# Patient Record
Sex: Male | Born: 1980 | Race: White | Hispanic: No | Smoking: Current every day smoker
Health system: Southern US, Community
[De-identification: ages and names within clinical notes are randomized; demographics above are authoritative.]

## PROBLEM LIST (undated history)

## (undated) DIAGNOSIS — J449 Chronic obstructive pulmonary disease, unspecified: Secondary | ICD-10-CM

---
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
## 2016-03-20 ENCOUNTER — Ambulatory Visit (INDEPENDENT_AMBULATORY_CARE_PROVIDER_SITE_OTHER): Payer: Medicaid Other | Admitting: Podiatry

## 2016-03-20 ENCOUNTER — Encounter: Payer: Self-pay | Admitting: Podiatry

## 2016-03-20 VITALS — BP 124/83 | HR 87 | Resp 14

## 2016-03-20 DIAGNOSIS — R52 Pain, unspecified: Secondary | ICD-10-CM

## 2016-03-20 DIAGNOSIS — B351 Tinea unguium: Secondary | ICD-10-CM | POA: Diagnosis not present

## 2016-03-20 DIAGNOSIS — L03031 Cellulitis of right toe: Secondary | ICD-10-CM

## 2016-03-20 NOTE — Progress Notes (Signed)
Subjective:     Patient ID: Donald Avery, male   DOB: 01-19-1981, 35 y.o.   MRN: [id number]  HPI this patient presents to the office with chief complaint of painful big toe, right foot. Patient states that approximately 4 weeks ago. There was severe pain on the inside border big toe, right foot. He proceeded to self treat and remove the ingrowing toenail himself at this time. The nail has not healed properly and he is having continued pain and discomfort in the great toenail, right foot. This patient also has a medical problem for which he cannot bend over and properly trim his own nails. He has tried but there is hematoma noted at each toe, both feet. He admits only the big toe on the right foot to be painful and desires an evaluation and treatment of this nail   Review of Systems     Objective:   Physical Exam GENERAL APPEARANCE: Alert, conversant. Appropriately groomed. No acute distress.  VASCULAR: Pedal pulses are  palpable at  [facility] bilateral.  Capillary refill time is immediate to all digits,  Normal temperature gradient.  Digital hair growth is present bilateral  NEUROLOGIC: sensation is normal to 5.07 monofilament at 5/5 sites bilateral.  Light touch is intact bilateral, Muscle strength normal.  MUSCULOSKELETAL: acceptable muscle strength, tone and stability bilateral.  Intrinsic muscluature intact bilateral.  Rectus appearance of foot and digits noted bilateral.   DERMATOLOGIC: skin color, texture, and turgor are within normal limits.  No preulcerative lesions or ulcers  are seen, no interdigital maceration noted.  No open lesions present.  . No drainage noted.  Nails  There is thick disfigured discolored nail right hallux with granulation and necrotic tissue in medial aspect right hallux proximal nail fold.      Assessment:     Onychomycosis right hallux   Paronychia right hallux.     Plan:     IE  Debride necrotic tissue medial border right hallux.  Nail surgery for right  hallux removal.  Treatment options and alternatives discussed.  Recommended permanent phenol matrixectomy and patient agreed.  Right hallux  was prepped with alcohol and a toe block of 3cc of 2% lidocaine plain was administered in a digital toe block. .  The toe was then prepped with betadine solution .  The offending nail border was then excised and matrix tissue exposed.  Phenol was then applied to the matrix tissue followed by an alcohol wash.  Antibiotic ointment and a dry sterile dressing was applied.  The patient was dispensed instructions for aftercare. RTC 1 week.  No antibiotics needed after evaluation right hallux.    Helane GuntherGregory Leanor Avery DPM    Helane GuntherGregory Rendon Howell DPM

## 2016-03-27 ENCOUNTER — Ambulatory Visit: Payer: Medicaid Other | Admitting: Podiatry

## 2016-04-03 ENCOUNTER — Ambulatory Visit: Payer: Medicaid Other | Admitting: Podiatry

## 2016-05-01 ENCOUNTER — Encounter: Payer: Self-pay | Admitting: Podiatry

## 2016-05-01 ENCOUNTER — Ambulatory Visit (INDEPENDENT_AMBULATORY_CARE_PROVIDER_SITE_OTHER): Payer: Medicare Other | Admitting: Podiatry

## 2016-05-01 DIAGNOSIS — Z09 Encounter for follow-up examination after completed treatment for conditions other than malignant neoplasm: Secondary | ICD-10-CM

## 2016-05-01 MED ORDER — CEPHALEXIN 500 MG PO CAPS: 500.0000 mg | ORAL_CAPSULE | Freq: Two times a day (BID) | ORAL | Status: AC

## 2016-05-01 NOTE — Progress Notes (Signed)
Subjective:     Patient ID: Donald Avery, male   DOB: [birth year], 35 y.o.   MRN: [id number]  HPI this patient returns the office follow-up for nail surgery for the removal of the great toenail, right foot surgery was done back in April and he never came in for his follow-up examination, he now says that there is an area of drainage at the site of the nail and pain on the  outside border big toe, right foot.  He presents the office today for an evaluation and treatment of his toenail   Review of Systems     Objective:   Physical Exam GENERAL APPEARANCE: Alert, conversant. Appropriately groomed. No acute distress.  VASCULAR: Pedal pulses are  [facility]pable [facility]  [facility].  Capillary refill time is immediate to all digits,  Normal temperature gradient.  Digital hair growth is present bilateral  NEUROLOGIC: sensation is normal to 5.07 monofilament at 5/5 sites bilateral.  Light touch is intact bilateral, Muscle strength normal.  MUSCULOSKELETAL: acceptable muscle strength, tone and stability bilateral.  Intrinsic muscluature intact bilateral.  Rectus appearance of foot and digits noted bilateral.   DERMATOLOGIC: skin color, texture, and turgor are within normal limits.  No preulcerative lesions or ulcers  are seen, no interdigital maceration noted.  No open lesions present.  . No drainage noted. NAIL  Necrotic tissue laterally right hallux.  Small ulcerated region medial proximal border,  No redness or swelling to nail fold.     Assessment:     S/p nail surgery     Plan:     ROV  Debride necrotic tissue. Prescribe cephalexin.  RTC 3 weeks.   Helane GuntherGregory Abrar Koone DPM

## 2016-05-22 ENCOUNTER — Ambulatory Visit (INDEPENDENT_AMBULATORY_CARE_PROVIDER_SITE_OTHER): Payer: Medicare Other | Admitting: Podiatry

## 2016-05-22 ENCOUNTER — Encounter: Payer: Self-pay | Admitting: Podiatry

## 2016-05-22 DIAGNOSIS — R52 Pain, unspecified: Secondary | ICD-10-CM

## 2016-05-22 DIAGNOSIS — L03031 Cellulitis of right toe: Secondary | ICD-10-CM

## 2016-05-22 DIAGNOSIS — B351 Tinea unguium: Secondary | ICD-10-CM

## 2016-05-22 NOTE — Progress Notes (Signed)
Subjective:     Patient ID: Donald Avery, male   DOB: 1981-08-10, 35 y.o.   MRN: [id number]  HPI this patient presents to the office follow-up for an infection in the big toe, right foot. He was seen 3 weeks ago and a prescription of cephalexin was called in to the pharmacy. He says that after 5 days the infection in his big toe, right foot, improved. He now says that the fifth toe on the right foot has been causing him pain for 2 months. Patient states that when he walks he puts  more pressure on the outside of his right foot which  causes severe pain fifth toenail right foot. He says there is no drainage only severe pain. He requests an invaluable evaluation of this condition at this visit   Review of Systems     Objective:   Physical Exam GENERAL APPEARANCE: Alert, conversant. Appropriately groomed. No acute distress.  VASCULAR: Pedal pulses are  palpable at  [facility] bilateral.  Capillary refill time is immediate to all digits,  Normal temperature gradient.  Digital hair growth is present bilateral  NEUROLOGIC: sensation is normal to 5.07 monofilament at 5/5 sites bilateral.  Light touch is intact bilateral, Muscle strength normal.  MUSCULOSKELETAL: acceptable muscle strength, tone and stability bilateral.  Intrinsic muscluature intact bilateral.  Rectus appearance of foot and digits noted bilateral.   DERMATOLOGIC: skin color, texture, and turgor are within normal limits.  No preulcerative lesions or ulcers  are seen, no interdigital maceration noted.  No open lesions present.   No drainage noted.  NAILS  There is callus tissue on the right great toenail with no evidence of infection.  His fifth toenail is thick disfigured and discolored.  Redness and inflammation noted medial aspect fifth toenail right foot.      Assessment:     Onychomycosis     Plan:     ROV  His great toenail has healed and is no longer infected. He has a fifth toenail, right foot, which is traumatized and  moving causing him significant pain during gait. Nail surgery was performed on fifth toe right foot.  Treatment options and alternatives discussed.  Recommended permanent phenol matrixectomy and patient agreed.  Right fifth toe  was prepped with alcohol and a toe block of 3cc of 2% lidocaine plain was administered in a digital toe block. .  The toe was then prepped with betadine solution .  The offending nail border was then excised and matrix tissue exposed.  Phenol was then applied to the matrix tissue followed by an alcohol wash.  Antibiotic ointment and a dry sterile dressing was applied.  The patient was dispensed instructions for aftercare.  Cephalexin was prescribed.  RTC 2 weeks.   Donald Avery DPM     Donald GuntherGregory Layni Kreamer DPM

## 2016-05-23 ENCOUNTER — Other Ambulatory Visit: Payer: Self-pay | Admitting: *Deleted

## 2016-05-23 MED ORDER — CEPHALEXIN 500 MG PO CAPS: 500.0000 mg | ORAL_CAPSULE | Freq: Four times a day (QID) | ORAL | Status: AC

## 2016-06-05 ENCOUNTER — Encounter: Payer: Self-pay | Admitting: Podiatry

## 2016-06-05 ENCOUNTER — Ambulatory Visit (INDEPENDENT_AMBULATORY_CARE_PROVIDER_SITE_OTHER): Payer: Medicare Other | Admitting: Podiatry

## 2016-06-05 DIAGNOSIS — Z09 Encounter for follow-up examination after completed treatment for conditions other than malignant neoplasm: Secondary | ICD-10-CM

## 2016-06-05 NOTE — Progress Notes (Signed)
Patient ID: Donald HissRichard Sobol, male   DOB: Aug 30, 1981, 35 y.o.   MRN: [id number] This patient returns to the office following nail surgery two  weeks ago.  The patient says toe has been soaked but not bandaged well.  His feet and toe are blackened. Patient takes antibiotics.  There has been improvement of the toe since the surgery has been performed. The patient presents for continued evaluation and treatment.  GENERAL APPEARANCE: Alert, conversant. Appropriately groomed. No acute distress.  VASCULAR: Pedal [facility] palpable at  [facility].  Capillary refill time is immediate to all digits,  Normal temperature gradient.    NEUROLOGIC: sensation is normal to 5.07 monofilament at 5/5 sites bilateral.  Light touch is intact bilateral, Muscle strength normal.  MUSCULOSKELETAL: acceptable muscle strength, tone and stability bilateral.  Intrinsic muscluature intact bilateral.  Rectus appearance of foot and digits noted bilateral.   DERMATOLOGIC: skin color, texture, and turgor are within normal limits.  No preulcerative lesions or ulcers  are seen, no interdigital maceration noted.   NAILS  There is necrotic tissue along the nail groove  In the absence of redness swelling and pain.  DX  S/p nail surgery  ROV  Home instructions were discussed.  Patient to call the office if there are any questions or concerns.   Helane GuntherGregory Elara Cocke DPM

## 2016-07-04 ENCOUNTER — Other Ambulatory Visit (HOSPITAL_COMMUNITY): Payer: Self-pay | Admitting: Nurse Practitioner

## 2016-07-04 DIAGNOSIS — B182 Chronic viral hepatitis C: Secondary | ICD-10-CM

## 2016-08-15 ENCOUNTER — Ambulatory Visit (HOSPITAL_COMMUNITY)
Admission: RE | Admit: 2016-08-15 | Discharge: 2016-08-15 | Disposition: A | Discharge status: Home / Self Care | Payer: Medicare Other | Source: Ambulatory Visit | Attending: Nurse Practitioner | Admitting: Nurse Practitioner

## 2016-08-15 DIAGNOSIS — B182 Chronic viral hepatitis C: Secondary | ICD-10-CM | POA: Diagnosis present

## 2016-08-28 DIAGNOSIS — F418 Other specified anxiety disorders: Secondary | ICD-10-CM | POA: Diagnosis not present

## 2016-08-28 DIAGNOSIS — K219 Gastro-esophageal reflux disease without esophagitis: Secondary | ICD-10-CM | POA: Diagnosis not present

## 2016-08-28 DIAGNOSIS — A419 Sepsis, unspecified organism: Secondary | ICD-10-CM | POA: Diagnosis not present

## 2016-08-28 DIAGNOSIS — J309 Allergic rhinitis, unspecified: Secondary | ICD-10-CM

## 2016-08-28 DIAGNOSIS — L0291 Cutaneous abscess, unspecified: Secondary | ICD-10-CM

## 2016-08-28 DIAGNOSIS — G319 Degenerative disease of nervous system, unspecified: Secondary | ICD-10-CM

## 2016-08-28 DIAGNOSIS — B1920 Unspecified viral hepatitis C without hepatic coma: Secondary | ICD-10-CM

## 2016-08-29 DIAGNOSIS — A419 Sepsis, unspecified organism: Secondary | ICD-10-CM | POA: Diagnosis not present

## 2016-08-29 DIAGNOSIS — L0291 Cutaneous abscess, unspecified: Secondary | ICD-10-CM | POA: Diagnosis not present

## 2016-08-29 DIAGNOSIS — K219 Gastro-esophageal reflux disease without esophagitis: Secondary | ICD-10-CM | POA: Diagnosis not present

## 2016-08-29 DIAGNOSIS — F418 Other specified anxiety disorders: Secondary | ICD-10-CM | POA: Diagnosis not present

## 2016-08-30 DIAGNOSIS — K219 Gastro-esophageal reflux disease without esophagitis: Secondary | ICD-10-CM | POA: Diagnosis not present

## 2016-08-30 DIAGNOSIS — F418 Other specified anxiety disorders: Secondary | ICD-10-CM | POA: Diagnosis not present

## 2016-08-30 DIAGNOSIS — L0291 Cutaneous abscess, unspecified: Secondary | ICD-10-CM | POA: Diagnosis not present

## 2016-08-30 DIAGNOSIS — A419 Sepsis, unspecified organism: Secondary | ICD-10-CM | POA: Diagnosis not present

## 2017-02-27 ENCOUNTER — Encounter (HOSPITAL_COMMUNITY): Payer: Self-pay | Admitting: *Deleted

## 2017-02-27 ENCOUNTER — Emergency Department (HOSPITAL_COMMUNITY): Payer: No Typology Code available for payment source

## 2017-02-27 ENCOUNTER — Emergency Department (HOSPITAL_COMMUNITY)
Admission: EM | Admit: 2017-02-27 | Discharge: 2017-02-27 | Disposition: A | Discharge status: Home / Self Care | Payer: No Typology Code available for payment source | Attending: Emergency Medicine | Admitting: Emergency Medicine

## 2017-02-27 DIAGNOSIS — M542 Cervicalgia: Secondary | ICD-10-CM | POA: Diagnosis not present

## 2017-02-27 DIAGNOSIS — R0781 Pleurodynia: Secondary | ICD-10-CM | POA: Insufficient documentation

## 2017-02-27 DIAGNOSIS — R938 Abnormal findings on diagnostic imaging of other specified body structures: Secondary | ICD-10-CM | POA: Diagnosis not present

## 2017-02-27 DIAGNOSIS — Y999 Unspecified external cause status: Secondary | ICD-10-CM | POA: Diagnosis not present

## 2017-02-27 DIAGNOSIS — Y939 Activity, unspecified: Secondary | ICD-10-CM | POA: Insufficient documentation

## 2017-02-27 DIAGNOSIS — Z79899 Other long term (current) drug therapy: Secondary | ICD-10-CM | POA: Diagnosis not present

## 2017-02-27 DIAGNOSIS — S80819A Abrasion, unspecified lower leg, initial encounter: Secondary | ICD-10-CM | POA: Diagnosis not present

## 2017-02-27 DIAGNOSIS — J449 Chronic obstructive pulmonary disease, unspecified: Secondary | ICD-10-CM | POA: Diagnosis not present

## 2017-02-27 DIAGNOSIS — S022XXA Fracture of nasal bones, initial encounter for closed fracture: Secondary | ICD-10-CM | POA: Diagnosis not present

## 2017-02-27 DIAGNOSIS — Y92410 Unspecified street and highway as the place of occurrence of the external cause: Secondary | ICD-10-CM | POA: Diagnosis not present

## 2017-02-27 DIAGNOSIS — F17200 Nicotine dependence, unspecified, uncomplicated: Secondary | ICD-10-CM | POA: Insufficient documentation

## 2017-02-27 DIAGNOSIS — S0181XA Laceration without foreign body of other part of head, initial encounter: Secondary | ICD-10-CM | POA: Insufficient documentation

## 2017-02-27 DIAGNOSIS — S40819A Abrasion of unspecified upper arm, initial encounter: Secondary | ICD-10-CM | POA: Diagnosis not present

## 2017-02-27 DIAGNOSIS — M546 Pain in thoracic spine: Secondary | ICD-10-CM | POA: Diagnosis not present

## 2017-02-27 DIAGNOSIS — S0992XA Unspecified injury of nose, initial encounter: Secondary | ICD-10-CM | POA: Diagnosis present

## 2017-02-27 DIAGNOSIS — Z23 Encounter for immunization: Secondary | ICD-10-CM | POA: Diagnosis not present

## 2017-02-27 HISTORY — DX: Chronic obstructive pulmonary disease, unspecified: J44.9

## 2017-02-27 LAB — CBC WITH DIFFERENTIAL/PLATELET
BASOS ABS: 0.1 10*3/uL (ref 0.0–0.1)
BASOS PCT: 1 %
EOS ABS: 0.2 10*3/uL (ref 0.0–0.7)
EOS PCT: 1 %
HCT: 43.3 % (ref 39.0–52.0)
HEMOGLOBIN: 14.7 g/dL (ref 13.0–17.0)
LYMPHS PCT: 25 %
Lymphs Abs: 3.6 10*3/uL (ref 0.7–4.0)
MCH: 28.2 pg (ref 26.0–34.0)
MCHC: 33.9 g/dL (ref 30.0–36.0)
MCV: 83.1 fL (ref 78.0–100.0)
Monocytes Absolute: 1.2 10*3/uL — ABNORMAL HIGH (ref 0.1–1.0)
Monocytes Relative: 9 %
Neutro Abs: 9.4 10*3/uL — ABNORMAL HIGH (ref 1.7–7.7)
Neutrophils Relative %: 64 %
PLATELETS: 362 10*3/uL (ref 150–400)
RBC: 5.21 MIL/uL (ref 4.22–5.81)
RDW: 13.9 % (ref 11.5–15.5)
WBC: 14.5 10*3/uL — AB (ref 4.0–10.5)

## 2017-02-27 LAB — I-STAT CHEM 8, ED
BUN: 21 mg/dL — ABNORMAL HIGH (ref 6–20)
Calcium, Ion: 1.06 mmol/L — ABNORMAL LOW (ref 1.15–1.40)
Chloride: 105 mmol/L (ref 101–111)
Creatinine, Ser: 1.3 mg/dL — ABNORMAL HIGH (ref 0.61–1.24)
GLUCOSE: 92 mg/dL (ref 65–99)
HCT: 44 % (ref 39.0–52.0)
HEMOGLOBIN: 15 g/dL (ref 13.0–17.0)
POTASSIUM: 3.8 mmol/L (ref 3.5–5.1)
Sodium: 139 mmol/L (ref 135–145)
TCO2: 24 mmol/L (ref 0–100)

## 2017-02-27 MED ORDER — LIDOCAINE-EPINEPHRINE (PF) 2 %-1:200000 IJ SOLN
20.0000 mL | Freq: Once | INTRAMUSCULAR | Status: AC
  Administered 2017-02-27: 20 mL
  Filled 2017-02-27: qty 20

## 2017-02-27 MED ORDER — IOPAMIDOL (ISOVUE-300) INJECTION 61%
INTRAVENOUS | Status: AC
  Filled 2017-02-27: qty 75

## 2017-02-27 MED ORDER — IOPAMIDOL (ISOVUE-300) INJECTION 61%
INTRAVENOUS | Status: AC
  Administered 2017-02-27: 100 mL
  Filled 2017-02-27: qty 100

## 2017-02-27 MED ORDER — MORPHINE SULFATE (PF) 4 MG/ML IV SOLN
4.0000 mg | Freq: Once | INTRAVENOUS | Status: AC
  Administered 2017-02-27: 4 mg via INTRAVENOUS
  Filled 2017-02-27: qty 1

## 2017-02-27 MED ORDER — TETANUS-DIPHTH-ACELL PERTUSSIS 5-2.5-18.5 LF-MCG/0.5 IM SUSP
0.5000 mL | Freq: Once | INTRAMUSCULAR | Status: AC
  Administered 2017-02-27: 0.5 mL via INTRAMUSCULAR
  Filled 2017-02-27: qty 0.5
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
## 2017-02-27 NOTE — ED Notes (Signed)
Patient transported to CT 
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
## 2024-05-28 ENCOUNTER — Ambulatory Visit: Admitting: Physician Assistant
